# Patient Record
Sex: Male | Born: 1995 | Race: White | Hispanic: No | Marital: Single | Smoking: Current some day smoker
Health system: Southern US, Community
[De-identification: ages and names within clinical notes are randomized; demographics above are authoritative.]

---
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
## 2018-08-30 ENCOUNTER — Emergency Department (HOSPITAL_COMMUNITY)
Admission: EM | Admit: 2018-08-30 | Discharge: 2018-08-30 | Disposition: A | Discharge status: Home / Self Care | Payer: Self-pay | Attending: Emergency Medicine | Admitting: Emergency Medicine

## 2018-08-30 ENCOUNTER — Encounter (HOSPITAL_COMMUNITY): Payer: Self-pay | Admitting: Emergency Medicine

## 2018-08-30 ENCOUNTER — Other Ambulatory Visit: Payer: Self-pay

## 2018-08-30 DIAGNOSIS — M545 Low back pain, unspecified: Secondary | ICD-10-CM

## 2018-08-30 DIAGNOSIS — F17200 Nicotine dependence, unspecified, uncomplicated: Secondary | ICD-10-CM | POA: Insufficient documentation

## 2018-08-30 MED ORDER — METHOCARBAMOL 500 MG PO TABS
750.0000 mg | ORAL_TABLET | Freq: Once | ORAL | Status: AC
  Administered 2018-08-30: 750 mg via ORAL
  Filled 2018-08-30: qty 2

## 2018-08-30 MED ORDER — METHOCARBAMOL 500 MG PO TABS: 500.0000 mg | ORAL_TABLET | Freq: Two times a day (BID) | ORAL | 0 refills | Status: AC

## 2018-08-30 MED ORDER — METHOCARBAMOL 500 MG PO TABS: 500.0000 mg | ORAL_TABLET | Freq: Two times a day (BID) | ORAL | 0 refills | Status: DC

## 2018-08-30 NOTE — ED Triage Notes (Signed)
Patient complaining of lower back pain that started Saturday. 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 The pain has not gotten better.

## 2018-08-30 NOTE — Discharge Instructions (Signed)
Have been evaluated in the ED today for evaluation of back pain.  Feel your pain is most likely musculoskeletal in nature.  I have prescribed Robaxin, a muscle relaxer, please take as prescribed.  Please do not drive while you are on this medication.  You may also try ice/heat to the area.  Please try to rest.  You may also take ibuprofen.  Return to the ED with any of the following symptoms.  You cannot control when you poop (bowel movement) or pee (urinate). Your arms or legs feel weak. Your arms or legs lose feeling (numbness). You feel sick to your stomach (nauseous) or throw up (vomit). You have belly (abdominal) pain. You feel like you may pass out (faint).

## 2018-08-30 NOTE — ED Provider Notes (Signed)
[facility]-EMERGENCY DEPT Provider Note   CSN: [id number] Arrival date & time: 08/30/18  1900     History   Chief Complaint Chief Complaint  Patient presents with  . Back Pain    HPI Noah Newton is a 22 y.o. male presents for evaluation of back pain.  States pain began last Saturday when he was at work bent over for a long duration of time.  Feels like the pain is a spasm feeling and radiates across his lumbar spine however worse on the right side.  States pain disappears if he lays flat or does not move.  Has tried ibuprofen and icy hot patches which he says has helped with the pain however has not relieved it completely.  Denies fever, chills, numbness, tingling, abdominal pain, urinary symptoms, IV drug use, bladder or bowel incontinence, saddle paresthesias,unexplained weight loss.  Denies radiation of pain.  Denies history of trauma HPI  History reviewed. No pertinent past medical history.  There are no active problems to display for this patient.   History reviewed. No pertinent surgical history.      Home Medications    Prior to Admission medications   Medication Sig Start Date End Date Taking? Authorizing Provider  methocarbamol (ROBAXIN) 500 MG tablet Take 1 tablet (500 mg total) by mouth 2 (two) times daily. 08/30/18   Genesee Nase A, PA-C    Family History History reviewed. No pertinent family history.  Social History Social History   Tobacco Use  . Smoking status: Current Some Day Smoker  . Smokeless tobacco: Never Used  Substance Use Topics  . Alcohol use: Not Currently  . Drug use: Not Currently     Allergies   Patient has no known allergies.   Review of Systems Review of Systems View of systems negative unless otherwise stated in the HPI.  Physical Exam Updated Vital Signs BP (!) 147/86 (BP Location: Left Arm)   Pulse (!) 135   Temp 98.3 F (36.8 C) (Oral)   Resp 16   Ht 6\' 1"  (1.854 m)   Wt 79.4 kg   SpO2  99%   BMI 23.09 kg/m   Physical Exam  Physical Exam  Constitutional: Pt appears well-developed and well-nourished. No distress.  HENT:  Head: Normocephalic and atraumatic.  Mouth/Throat: Oropharynx is clear and moist. No oropharyngeal exudate.  Eyes: Conjunctivae are normal.  Neck: Normal range of motion. Neck supple.  Full ROM without pain  Cardiovascular: Normal rate, regular rhythm and intact distal pulses.   Pulmonary/Chest: Effort normal and breath sounds normal. No respiratory distress. Pt has no wheezes.  Abdominal: Soft. Pt exhibits no distension. There is no tenderness, rebound or guarding. No abd bruit or pulsatile mass Musculoskeletal:  Full range of motion of the T-spine and L-spine with flexion, hyperextension, and lateral flexion. No midline tenderness or stepoffs. No tenderness to palpation of the spinous processes of the T-spine or L-spine. Mild tenderness to palpation of the paraspinous muscles of the L-spine. Negative straight leg raise. Lymphadenopathy:    Pt has no cervical adenopathy.  Neurological: Pt is alert. Pt has normal reflexes.  Reflex Scores:      Bicep reflexes are 2+ on the right side and 2+ on the left side.      Brachioradialis reflexes are 2+ on the right side and 2+ on the left side.      Patellar reflexes are 2+ on the right side and 2+ on the left side.      Achilles  reflexes are 2+ on the right side and 2+ on the left side. Speech is clear and goal oriented, follows commands Normal 5/5 strength in upper and lower extremities bilaterally including dorsiflexion and plantar flexion, strong and equal grip strength Sensation normal to light and sharp touch Moves extremities without ataxia, coordination intact Normal gait Normal balance No Clonus Skin: Skin is warm and dry. No rash noted or lesions noted. Pt is not diaphoretic. No erythema, ecchymosis,edema or warmth.  Psychiatric: Pt has a normal mood and affect. Behavior is normal.  Nursing  note and vitals reviewed. ED Treatments / Results  Labs (all labs ordered are listed, but only abnormal results are displayed) Labs Reviewed - No data to display  EKG None  Radiology No results found.  Procedures Procedures (including critical care time)  Medications Ordered in ED Medications  methocarbamol (ROBAXIN) tablet 750 mg (has no administration in time range)     Initial Impression / Assessment and Plan / ED Course  I have reviewed the triage vital signs and the nursing notes as well as past medical history.  Pertinent labs & imaging results that were available during my care of the patient were reviewed by me and considered in my medical decision making (see chart for details).   Patient with back pain.  No neurological deficits and normal neuro exam.  Patient can walk but states is painful with movement. No urinary symptoms.  No loss of bowel or bladder control.  No concern for cauda equina.  No fever, night sweats, weight loss, h/o cancer, IVDU.  RICE protocol and pain medicine indicated and discussed with patient.  Strict return precautions given.  Patient and family voiced understanding.  Final Clinical Impressions(s) / ED Diagnoses   Final diagnoses:  Acute bilateral low back pain without sciatica    ED Discharge Orders         Ordered    methocarbamol (ROBAXIN) 500 MG tablet  2 times daily     08/30/18 2120           Caidin Heidenreich A, PA-C 08/30/18 2225    Mancel Bale, MD 08/30/18 2337

## 2019-05-15 ENCOUNTER — Emergency Department (HOSPITAL_COMMUNITY)
Admission: EM | Admit: 2019-05-15 | Discharge: 2019-05-15 | Disposition: A | Discharge status: Home / Self Care | Payer: Self-pay | Attending: Emergency Medicine | Admitting: Emergency Medicine

## 2019-05-15 ENCOUNTER — Other Ambulatory Visit: Payer: Self-pay

## 2019-05-15 ENCOUNTER — Encounter (HOSPITAL_COMMUNITY): Payer: Self-pay | Admitting: Emergency Medicine

## 2019-05-15 DIAGNOSIS — R109 Unspecified abdominal pain: Secondary | ICD-10-CM | POA: Insufficient documentation

## 2019-05-15 DIAGNOSIS — Z5321 Procedure and treatment not carried out due to patient leaving prior to being seen by health care provider: Secondary | ICD-10-CM | POA: Insufficient documentation

## 2019-05-15 NOTE — ED Notes (Signed)
Pt LAMA, RN educated Pt on risk of LWBS. Pt stated understanding. RN informed Pt to return if symptoms persist

## 2019-05-15 NOTE — ED Triage Notes (Signed)
Pt states for the month every time he eats or drinks he gets heart burn with belching and gas- pt has tried tums with no relief. Pt denies any chest or abd pain.

## 2022-03-05 ENCOUNTER — Encounter (HOSPITAL_COMMUNITY): Payer: Self-pay | Admitting: Emergency Medicine

## 2022-03-05 ENCOUNTER — Emergency Department (HOSPITAL_COMMUNITY)
Admission: EM | Admit: 2022-03-05 | Discharge: 2022-03-05 | Disposition: A | Discharge status: Home / Self Care | Payer: Self-pay | Attending: Emergency Medicine | Admitting: Emergency Medicine

## 2022-03-05 ENCOUNTER — Other Ambulatory Visit: Payer: Self-pay

## 2022-03-05 DIAGNOSIS — K029 Dental caries, unspecified: Secondary | ICD-10-CM | POA: Insufficient documentation

## 2022-03-05 DIAGNOSIS — K0889 Other specified disorders of teeth and supporting structures: Secondary | ICD-10-CM

## 2022-03-05 MED ORDER — AMOXICILLIN-POT CLAVULANATE 875-125 MG PO TABS: 1.0000 | ORAL_TABLET | Freq: Two times a day (BID) | ORAL | 0 refills | Status: AC

## 2022-03-05 NOTE — ED Notes (Signed)
Reviewed discharge instructions with patient. Follow-up care and medications reviewed. Patient  verbalized understanding. Patient A&Ox4, VSS, and ambulatory with steady gait upon discharge.  °

## 2022-03-05 NOTE — ED Triage Notes (Signed)
Patient here with complaint of right upper dental pain that started two days ago. Patient took 600mg  ibuprofen this morning for pain and swelling. Patient alert, oriented, ambulatory, and in no apparent distress at this time. ?

## 2022-03-05 NOTE — ED Provider Notes (Signed)
?[facility] ?Provider Note ? ? ?CSN: [id number] ?Arrival date & time: 03/05/22  0841 ? ?  ? ?History ? ?Chief Complaint  ?Patient presents with  ? Dental Pain  ? ? ?Noah Newton is a 26 y.o. male. ? ?Patient with no pertinent past medical history presents today with chief complaint of dental pain.  He states that same began bothering him 2 days ago.  States that he has been trying to manage his symptoms with Tylenol and ibuprofen but has not had success.  History of multiple dental caries in the past, states that he is unable to see a dentist right now due to financial hardship.  His pain is located on his left upper portion of his mouth in his back molar.  Also endorses some swelling but no current drainage.  Also denies fevers, chills, headaches, painful EOMs, nausea, vomiting.  No voice changes or trouble swallowing. ? ? ?Dental Pain ?Associated symptoms: no drooling and no fever   ? ?  ? ?Home Medications ?Prior to Admission medications   ?Medication Sig Start Date End Date Taking? Authorizing Provider  ?methocarbamol (ROBAXIN) 500 MG tablet Take 1 tablet (500 mg total) by mouth 2 (two) times daily. 08/30/18   Henderly, Britni A, PA-C  ?   ? ?Allergies    ?Patient has no known allergies.   ? ?Review of Systems   ?Review of Systems  ?Constitutional:  Negative for chills and fever.  ?HENT:  Positive for dental problem. Negative for drooling.   ?Respiratory:  Negative for shortness of breath.   ?Cardiovascular:  Negative for chest pain.  ?All other systems reviewed and are negative. ? ?Physical Exam ?Updated Vital Signs ?BP 139/78 (BP Location: Right Arm)   Pulse 94   Temp 97.7 ?F (36.5 ?C) (Oral)   Resp 16   SpO2 100%  ?Physical Exam ?Vitals and nursing note reviewed.  ?Constitutional:   ?   General: He is not in acute distress. ?   Appearance: Normal appearance. He is normal weight. He is not ill-appearing, toxic-appearing or diaphoretic.  ?   Comments: Patient resting  comfortably in bed in no acute distress  ?HENT:  ?   Head: Normocephalic and atraumatic.  ?   Mouth/Throat:  ?   Lips: Pink.  ?   Mouth: Mucous membranes are moist. No oral lesions or angioedema.  ?   Dentition: Abnormal dentition. Has dentures. Dental tenderness and dental caries present. No gingival swelling, dental abscesses or gum lesions.  ?   Tongue: No lesions. Tongue does not deviate from midline.  ?   Palate: No mass and lesions.  ?   Pharynx: Oropharynx is clear. Uvula midline. No pharyngeal swelling or posterior oropharyngeal erythema.  ? ?   Comments: Dentition poor throughout the mouth with multiple dental caries present.  No gross abscess visualized.  No signs of Ludwick's angina.  Tenderness noted to right upper molar, see image for further ?Neurological:  ?   Mental Status: He is alert.  ? ? ?ED Results / Procedures / Treatments   ?Labs ?(all labs ordered are listed, but only abnormal results are displayed) ?Labs Reviewed - No data to display ? ?EKG ?None ? ?Radiology ?No results found. ? ?Procedures ?Procedures  ? ? ?Medications Ordered in ED ?Medications - No data to display ? ?ED Course/ Medical Decision Making/ A&P ?  ?                        ?  Medical Decision Making ? ?Patient presents today with dental pain over the past 2 days.  He has multiple dental caries present throughout his mouth, no gross abscess visualized on exam.  Exam unconcerning for Ludwig's angina or spread of infection.  He is afebrile, nontoxic-appearing, and in no acute distress with reassuring vital signs and benign physical exam.  Will treat with Augmentin and anti-inflammatories medicine.  Urged patient to follow-up with dentist.  Patient understanding and amenable with plan, educated on red flag symptoms of prompt immediate return.  Discharged in stable condition. ? ? ?Final Clinical Impression(s) / ED Diagnoses ?Final diagnoses:  ?Pain, dental  ? ? ?Rx / DC Orders ?ED Discharge Orders   ? ?      Ordered  ?   amoxicillin-clavulanate (AUGMENTIN) 875-125 MG tablet  Every 12 hours       ? 03/05/22 0959  ? ?  ?  ? ?  ?An After Visit Summary was printed and given to the patient. ? ? ?  ?Bud Face, PA-C ?03/05/22 1001 ? ?  ?Tegeler, Gwenyth Allegra, MD ?03/05/22 1605 ? ?

## 2022-03-05 NOTE — Discharge Instructions (Addendum)
As we discussed, it appears that you have a cavity that is infected.  I have given you a prescription for antibiotics for management of this.  Please take entire dose as prescribed to prevent worsening of infection.  However, the only way that your symptoms will completely resolve is to see a dentist.  I highly recommend that you do so.  ? ?Return if development of any new or worsening symptoms. ?
